# Patient Record
Sex: Male | Born: 1950
Health system: Southern US, Community
[De-identification: ages and names within clinical notes are randomized; demographics above are authoritative.]

## PROBLEM LIST (undated history)

## (undated) DIAGNOSIS — E785 Hyperlipidemia, unspecified: Secondary | ICD-10-CM

## (undated) DIAGNOSIS — I10 Essential (primary) hypertension: Secondary | ICD-10-CM

## (undated) HISTORY — PX: MEDIAL COLLATERAL LIGAMENT AND LATERAL COLLATERAL LIGAMENT REPAIR, KNEE: SHX2017

## (undated) HISTORY — PX: ANTERIOR CRUCIATE LIGAMENT REPAIR: SHX115

## (undated) HISTORY — DX: Hyperlipidemia, unspecified: E78.5

## (undated) HISTORY — DX: Essential (primary) hypertension: I10

---

## 2014-10-29 LAB — LIPID PANEL
Cholesterol: 211 mg/dL — AB (ref 0–200)
HDL: 36 mg/dL (ref 35–70)
LDL Cholesterol: 95 mg/dL
Triglycerides: 398 mg/dL — AB (ref 40–160)

## 2014-10-29 LAB — BASIC METABOLIC PANEL: Creatinine: 1.3 mg/dL (ref 0.6–1.3)

## 2014-10-29 LAB — CBC AND DIFFERENTIAL: HEMATOCRIT: 47 % (ref 41–53)

## 2014-10-29 LAB — TSH: TSH: 2.77 u[IU]/mL (ref 0.41–5.90)

## 2014-10-29 LAB — HEMOGLOBIN A1C: Hemoglobin A1C: 6

## 2016-05-19 ENCOUNTER — Ambulatory Visit (INDEPENDENT_AMBULATORY_CARE_PROVIDER_SITE_OTHER): Payer: Medicare Other | Admitting: Osteopathic Medicine

## 2016-05-19 ENCOUNTER — Encounter: Payer: Self-pay | Admitting: Osteopathic Medicine

## 2016-05-19 VITALS — BP 156/87 | HR 67 | Temp 97.7°F | Wt 207.0 lb

## 2016-05-19 DIAGNOSIS — R0602 Shortness of breath: Secondary | ICD-10-CM | POA: Diagnosis not present

## 2016-05-19 DIAGNOSIS — H6121 Impacted cerumen, right ear: Secondary | ICD-10-CM

## 2016-05-19 DIAGNOSIS — Z23 Encounter for immunization: Secondary | ICD-10-CM

## 2016-05-19 DIAGNOSIS — I10 Essential (primary) hypertension: Secondary | ICD-10-CM

## 2016-05-19 MED ORDER — LISINOPRIL 20 MG PO TABS: 20.0000 mg | ORAL_TABLET | Freq: Every day | ORAL | 3 refills | Status: DC

## 2016-05-19 NOTE — Patient Instructions (Signed)
For shortness of breath: Plan to follow-up with pulmonary function test, we will call you about adding this up. We will also call you once I have reviewed your old records, I like to confirm the lab work, chest x-ray, anything else that may be included. Please let me know if you haven't heard back that I have reviewed these results by the end of next week. We will also get a set up for a home sleep study to evaluate for possible sleep apnea. Restart her home blood pressure medications of the lisinopril, plan to follow-up with me in the office in 2-3 weeks for recheck blood pressure, possible repeat x-rays/labs.  Any other questions or concerns, please do not hesitate to give our clinic or call! Take care! -Dr. Sunnie NielsenA.     You should receive an email asking you to complete a brief survey regarding your experience today at [facility]. Please take a moment to respond to this survey. Our goal is to serve you! Constructive criticism helps us to improve, and positive feedback helps our practice to shine, plus it makes us smile! Thanks in advance for your feedback!

## 2016-05-19 NOTE — Progress Notes (Signed)
HPI: Jesse Romero is a 65 y.o. male  who presents to [facility] [place], 05/[place]/16,  for chief complaint of:  Chief Complaint  Patient presents with  . Establish Care    Chief complaint include establish care, ear pain/hearing loss worse on the right, shortness of breath on exertion.  Shortness of breath: Noticing that he is feeling more short-winded, particularly on exertion, this has been going on for about a year and has been relatively stable. Quit 1995 smoking, 2 packs per day at most, smoker for 30 years total. 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 CXR was normal 03/2016. 04/07/16 negative stress echo, Evaluated by cardiologist. Patient doesn't think he ever had a pulmonary function test. Reports sometime waking up at night to catch his breath, feeling choked. Wife notices this as well but does not notice any episodes of apnea.  Ear pain on the right, difficulty hearing, significant wax in the ear was flushed out by the nurse.   Patient is accompanied by wife who assists with history-taking.   Past medical, surgical, social and family history reviewed: Past Medical History:  Diagnosis Date  . Hyperlipidemia   . Hypertension    Past Surgical History:  Procedure Laterality Date  . ANTERIOR CRUCIATE LIGAMENT REPAIR    . MEDIAL COLLATERAL LIGAMENT AND LATERAL COLLATERAL LIGAMENT REPAIR, KNEE     Social History  Substance Use Topics  . Smoking status: Former Engineer, civil (consulting)moker    Start date: 1995  . Smokeless tobacco: Never Used  . Alcohol use No   History reviewed. No pertinent family history.   Current medication list and allergy/intolerance information reviewed:   Current Outpatient Prescriptions  Medication Sig Dispense Refill  . ibuprofen (ADVIL,MOTRIN) 200 MG tablet Take 200 mg by mouth every 6 (six) hours  as needed.    Marland Kitchen. omeprazole (PRILOSEC) 20 MG capsule Take 20 mg by mouth daily.     No current facility-administered medications for this visit.    No Known Allergies    Review of Systems:  Constitutional:  No  fever, no chills, No recent illness, No unintentional weight changes. No significant fatigue.   HEENT: No  headache, no vision change  Cardiac: No  chest pain, No  pressure, No palpitations, No  Orthopnea  Respiratory:  +shortness of breath. No  Cough  Gastrointestinal: No  abdominal pain, No  nausea, No  vomiting,  No  blood in stool, No  diarrhea, No  constipation   Musculoskeletal: No new myalgia/arthralgia  Skin: No  Rash, No other wounds/concerning lesions  Hem/Onc: No  easy bruising/bleeding, No dark/tarry stool  Endocrine: No cold intolerance,  No heat intolerance. No polyuria/polydipsia/polyphagia   Neurologic: No  weakness, No  dizziness  Psychiatric: No  concerns with depression, No  concerns with anxiety, No sleep problems, No mood problems  Exam:  BP (!) 156/87   Pulse 67   Temp 97.7 F (36.5 C) (Oral)   Wt 207 lb (93.9 kg)   SpO2 98%   Constitutional: VS see above. General Appearance: alert, well-developed, well-nourished, NAD  Eyes: Normal lids and conjunctive, non-icteric sclera  Ears, Nose, Mouth, Throat: MMM, Normal external inspection ears/nares/mouth/lips/gums. TM normal bilaterally.  Neck: No masses, trachea midline. No thyroid enlargement. No tenderness/mass appreciated. No lymphadenopathy  Respiratory: Normal respiratory effort. no wheeze, no rhonchi, no rales  Cardiovascular: S1/S2 normal, no murmur, no rub/gallop auscultated. RRR.  No lower extremity edema.   Gastrointestinal: Nontender, no masses. No hepatomegaly, no splenomegaly. No hernia appreciated. Bowel sounds normal. Rectal exam deferred.   Musculoskeletal: Gait normal. No clubbing/cyanosis of digits.   Neurological: No cranial nerve deficit on limited exam. Motor and  sensation intact and symmetric. Cerebellar reflexes intact. Normal balance/coordination. No tremor.   Skin: warm, dry, intact. No rash/ulcer. No concerning nevi or subq nodules on limited exam.    Psychiatric: Normal judgment/insight. Normal mood and affect. Oriented x3.    Records reviewed from cardiology, normal stress echo, see scanned documents.    ASSESSMENT/PLAN: Await records, sounds like this problem has been worked up already bit by bit over the past year or so, at least by cardiology more recently. Consider pulmonary cause. Need to bring patient back for PFT. Remote history of heavy smoking may be significant. Our clinic's spirometry machine has been having some problems, will check with staff about where he can get PFT performed and we'll order this.   Shortness of breath - Plan: Ambulatory referral to Sleep Studies  Waking at night short of breath - Plan: Ambulatory referral to Sleep Studies  Essential hypertension - Plan: lisinopril (PRINIVIL,ZESTRIL) 20 MG tablet  Need for prophylactic vaccination and inoculation against influenza - Plan: Flu Vaccine QUAD 36+ mos IM      Patient Instructions  For shortness of breath: Plan to follow-up with pulmonary function test, we will call you about adding this up. We will also call you once I have reviewed your old records, I like to confirm the lab work, chest x-ray, anything else that may be included. Please let me know if you haven't heard back that I have reviewed these results by the end of next week. We will also get a set up for a home sleep study to evaluate for possible sleep apnea. Restart your home blood pressure medications of the lisinopril, plan to follow-up with me in the office in 2-3 weeks for recheck blood pressure, possible repeat x-rays/labs.  Any other questions or concerns, please do not hesitate to give our clinic or call! Take care! -Dr. Mervyn Skeeters.       Visit summary with medication list and pertinent instructions  was printed for patient to review. All questions at time of visit were answered - patient instructed to contact office with any additional concerns. ER/RTC precautions were reviewed with the patient. Follow-up plan: Return in about 3 weeks (around 06/09/2016) for RECHECK BLOOD PRESSURE, FOLLOW-UP SHORTNESS OF BREATH (OV30).

## 2016-05-20 DIAGNOSIS — R0602 Shortness of breath: Secondary | ICD-10-CM | POA: Insufficient documentation

## 2016-05-20 DIAGNOSIS — I10 Essential (primary) hypertension: Secondary | ICD-10-CM | POA: Insufficient documentation

## 2016-05-25 ENCOUNTER — Encounter (HOSPITAL_BASED_OUTPATIENT_CLINIC_OR_DEPARTMENT_OTHER): Payer: Self-pay

## 2016-05-25 ENCOUNTER — Emergency Department (HOSPITAL_BASED_OUTPATIENT_CLINIC_OR_DEPARTMENT_OTHER)
Admission: EM | Admit: 2016-05-25 | Discharge: 2016-05-25 | Disposition: A | Discharge status: Home / Self Care | Payer: Medicare Other | Attending: Emergency Medicine | Admitting: Emergency Medicine

## 2016-05-25 DIAGNOSIS — Z87891 Personal history of nicotine dependence: Secondary | ICD-10-CM | POA: Insufficient documentation

## 2016-05-25 DIAGNOSIS — L299 Pruritus, unspecified: Secondary | ICD-10-CM | POA: Diagnosis present

## 2016-05-25 DIAGNOSIS — L237 Allergic contact dermatitis due to plants, except food: Secondary | ICD-10-CM | POA: Insufficient documentation

## 2016-05-25 DIAGNOSIS — Z79899 Other long term (current) drug therapy: Secondary | ICD-10-CM | POA: Diagnosis not present

## 2016-05-25 DIAGNOSIS — I10 Essential (primary) hypertension: Secondary | ICD-10-CM | POA: Insufficient documentation

## 2016-05-25 MED ORDER — HYDROCORTISONE 2.5 % EX LOTN: TOPICAL_LOTION | Freq: Two times a day (BID) | CUTANEOUS | 0 refills | Status: DC

## 2016-05-25 MED ORDER — PREDNISONE 20 MG PO TABS: 60.0000 mg | ORAL_TABLET | Freq: Every day | ORAL | 0 refills | Status: DC

## 2016-05-25 MED ORDER — PREDNISONE 50 MG PO TABS
60.0000 mg | ORAL_TABLET | Freq: Once | ORAL | Status: AC
  Administered 2016-05-25: 60 mg via ORAL
  Filled 2016-05-25: qty 1

## 2016-05-25 NOTE — ED Triage Notes (Signed)
C/o "poison oak" to face and arms-started today after lawn care yesterday-NAD steady gait

## 2016-05-25 NOTE — Discharge Instructions (Signed)
Continue taking Benadryl every 4-6 hours as needed for the rash and itching.

## 2016-05-25 NOTE — ED Provider Notes (Signed)
[facility] Provider Note   CSN: [id number] Arrival date & time: 05/25/16  2054  By signing my name below, I, Jesse Romero, attest that this documentation has been prepared under the direction and in the presence of Santiago GladHeather Keirstan Iannello, PA-C Electronically Signed: Soijett Romero, ED Scribe. 05/25/16. 10:23 PM.   History   Chief Complaint Chief Complaint  Patient presents with  . Poison Oak    HPI Algis LimingMichael Romero is a 65 y.o. male with a PMHx of HTN, hyperlipidemia, who presents to the Emergency Department complaining of pruritic rash onset yesterday. Pt notes that he was completing yardwork yesterday in shorts and noticed the rash following. Pt denies new soaps, medications, pets, environment, lotion, or detergent. Pt is having associated symptoms of color change, numbness of upper lip, and bilateral eye swelling. He notes that he has tried benadryl cream and oral benadryl with no relief of his symptoms. He denies wound, SOB, nausea, vomiting, trouble swallowing, oral swelling, and any other symptoms.    The history is provided by the patient. No language interpreter was used.    Past Medical History:  Diagnosis Date  . Hyperlipidemia   . Hypertension     Patient Active Problem List   Diagnosis Date Noted  . Shortness of breath 05/20/2016  . Waking at night short of breath 05/20/2016  . Essential hypertension 05/20/2016    Past Surgical History:  Procedure Laterality Date  . ANTERIOR CRUCIATE LIGAMENT REPAIR    . MEDIAL COLLATERAL LIGAMENT AND LATERAL COLLATERAL LIGAMENT REPAIR, KNEE         Home Medications    Prior to Admission medications   Medication Sig Start Date End Date Taking? Authorizing Provider  ibuprofen (ADVIL,MOTRIN) 200 MG tablet Take 200 mg by mouth every 6 (six) hours as needed.    Historical Provider, MD  lisinopril (PRINIVIL,ZESTRIL) 20 MG tablet Take 1 tablet (20 mg total) by mouth daily. 05/19/16   [name], DO  omeprazole  (PRILOSEC) 20 MG capsule Take 20 mg by mouth daily.    Historical Provider, MD    Family History No family history on file.  Social History Social History  Substance Use Topics  . Smoking status: Former Engineer, civil (consulting)moker    Start date: 1995  . Smokeless tobacco: Never Used  . Alcohol use No     Allergies   Review of patient's allergies indicates no known allergies.   Review of Systems Review of Systems  HENT: Negative for trouble swallowing.        No oral swelling  Eyes:       Bilateral eye swelling  Gastrointestinal: Negative for nausea and vomiting.  Skin: Positive for color change and rash (BUE and BLE). Negative for wound.  Neurological: Positive for numbness (upper lip).     Physical Exam Updated Vital Signs BP 143/95 (BP Location: Left Arm)   Pulse 79   Temp 97.9 F (36.6 C) (Oral)   Resp 18   Ht 5\' 6"  (1.676 m)   Wt 207 lb (93.9 kg)   SpO2 98%   BMI 33.41 kg/m   Physical Exam  Constitutional: He is oriented to person, place, and time. He appears well-developed and well-nourished. No distress.  HENT:  Head: Normocephalic and atraumatic.  Mouth/Throat: Oropharynx is clear and moist.  No swelling of lips, tongue, or throat. Oropharynx is patent  Eyes: EOM are normal.  Bilateral periorbital eye swelling.   Neck: Normal range of motion. Neck supple.  Cardiovascular: Normal rate and regular rhythm.  Pulmonary/Chest: Effort normal and breath sounds normal. No respiratory distress. He has no wheezes.  Patent airway  Musculoskeletal: Normal range of motion.  Neurological: He is alert and oriented to person, place, and time.  Skin: Skin is warm and dry. Rash noted. Rash is papular.  Erythematous papular rash of the arms and legs bilaterally.   Psychiatric: He has a normal mood and affect. His behavior is normal.  Nursing note and vitals reviewed.    ED Treatments / Results  DIAGNOSTIC STUDIES: Oxygen Saturation is 98% on RA, nl by my interpretation.     COORDINATION OF CARE: 10:20 PM Discussed treatment plan with pt at bedside which includes prednisone and pt agreed to plan.  Procedures Procedures (including critical care time)  Medications Ordered in ED Medications - No data to display   Initial Impression / Assessment and Plan / ED Course  I have reviewed the triage vital signs and the nursing notes.   Clinical Course     Final Clinical Impressions(s) / ED Diagnoses   Final diagnoses:  None   Patient presents today with a chief complaint of a rash.  Rash located on the arms and legs bilaterally.  Appearance consistent with Poison Ivy.  Patient given Prednisone, Hydrocortisone cream, and instructed to continue Benadryl.  No signs of anaphylaxis.  Stable for discharge.  Return precautions given. New Prescriptions New Prescriptions   No medications on file   I personally performed the services described in this documentation, which was scribed in my presence. The recorded information has been reviewed and is accurate.     [name], PA-C 05/26/16 0031    Melene Plan, DO 05/26/16 [phone]

## 2016-05-25 NOTE — ED Notes (Signed)
Pt verbalizes understanding of d/c instructions and denies any further needs at this time. 

## 2016-06-01 ENCOUNTER — Ambulatory Visit (INDEPENDENT_AMBULATORY_CARE_PROVIDER_SITE_OTHER): Payer: Medicare Other | Admitting: Osteopathic Medicine

## 2016-06-01 VITALS — BP 130/80 | HR 94 | Ht 66.0 in | Wt 208.0 lb

## 2016-06-01 DIAGNOSIS — R0602 Shortness of breath: Secondary | ICD-10-CM | POA: Diagnosis not present

## 2016-06-01 MED ORDER — ALBUTEROL SULFATE (2.5 MG/3ML) 0.083% IN NEBU
2.5000 mg | INHALATION_SOLUTION | Freq: Once | RESPIRATORY_TRACT | Status: AC
  Administered 2016-06-01: 2.5 mg via RESPIRATORY_TRACT

## 2016-06-01 NOTE — Progress Notes (Signed)
Pt came into clinic today for spirometry due to increased shortness of breath. Pt was able to preform spirometry successfully, no immediate complications. PCP will review results with Pt.

## 2016-06-01 NOTE — Patient Instructions (Signed)
Referral to pulmonology - let us know if you don't hear about scheduling an appointment If symptoms worsen or change, please seek care ASAP!  Take care! Dr. Mervyn SkeetersA.

## 2016-06-01 NOTE — Progress Notes (Signed)
HPI: Jesse Romero is a 65 y.o. male  who presents to [facility] today, 06/01/16,  for chief complaint of:  Chief Complaint  Patient presents with  . Shortness of Breath    Shortness of breath: Noticing that he is feeling more short-winded, particularly on exertion, this has been going on for about a year and has been relatively stable. Quit 1995 smoking, 2 packs per day at most, smoker for 30 years total. 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 CXR was normal 03/2016. 04/07/16 negative stress echo, Evaluated by cardiologist. Patient doesn't think he ever had a pulmonary function test prior to today. Reports sometime waking up at night to catch his breath, feeling choked. Wife notices this as well but does not notice any episodes of apnea. Scheduled for sleep study later this month. Labs reviewed from visit with previous PCP in August of this year, normal CBC and other labs.   Past medical, surgical, social and family history reviewed: Past Medical History:  Diagnosis Date  . Hyperlipidemia   . Hypertension    Past Surgical History:  Procedure Laterality Date  . ANTERIOR CRUCIATE LIGAMENT REPAIR    . MEDIAL COLLATERAL LIGAMENT AND LATERAL COLLATERAL LIGAMENT REPAIR, KNEE     Social History  Substance Use Topics  . Smoking status: Former Engineer, civil (consulting) date: 1995  . Smokeless tobacco: Never Used  . Alcohol use No   No family history on file.   Current medication list and allergy/intolerance information reviewed:   Current Outpatient Prescriptions  Medication Sig Dispense Refill  . ibuprofen (ADVIL,MOTRIN) 200 MG tablet Take 200 mg by mouth every 6 (six) hours as needed.    Marland Kitchen lisinopril (PRINIVIL,ZESTRIL) 20 MG tablet Take 1 tablet (20 mg total) by mouth daily. 90 tablet 3  . omeprazole (PRILOSEC) 20 MG  capsule Take 20 mg by mouth daily.     No current facility-administered medications for this visit.    No Known Allergies    Review of Systems:  Constitutional:  No  fever, no chills, No recent illness, No unintentional weight changes. No significant fatigue.   Cardiac: No  chest pain, No  pressure, No palpitations, No  Orthopnea  Respiratory:  +shortness of breath. No  Cough  Neurologic: No  weakness, No  dizziness   Exam:  BP 130/80   Pulse 94   Ht 5\' 6"  (1.676 m)   Wt 208 lb (94.3 kg)   SpO2 99%   BMI 33.57 kg/m   Constitutional: VS see above. General Appearance: alert, well-developed, well-nourished, NAD  Ears, Nose, Mouth, Throat: MMM,   Neck: No masses, trachea midline.  Respiratory: Normal respiratory effort. no wheeze, no rhonchi, no rales  Cardiovascular: S1/S2 normal, no murmur, no rub/gallop auscultated. RRR.    PFT INTERPRETATION  FEV1/FVC >70 *AND*  FEV1 >80% predicted  = NORMAL SPIROMETRY NORMAL? yes  1. Valid study? yes 2. Flow-Volume Loop: normal 3. FEV1/FVC: 101% pred, 74.8 at best  >70 = Normal  <70 = Obstructive    = COPD no 3. Severity of Obstruction ~ Post-Bronchodilator FEV1: n/a 5. Bronchodilator Challenge  Increase FEV1 or FVC by 200+mL? no *AND*  Increased same FEV1 or FVC by 12+%? no  Reversible? n/a 6. FVC <80% = Restrictive no  Records reviewed from cardiology, normal stress echo, see scanned documents.    ASSESSMENT/PLAN:    SOB (  shortness of breath) - Plan: Spirometry: Pre & Post Eval, albuterol (PROVENTIL) (2.5 MG/3ML) 0.083% nebulizer solution 2.5 mg, Ambulatory referral to Pulmonology   Cardiac stress echo, labs, PFTs are negative. Refer to pulmonology for further workup of stable dyspnea on exertion.  Patient Instructions  Referral to pulmonology - let us know if you don't hear about scheduling an appointment If symptoms worsen or change, please seek care ASAP!  Take care! Dr. Mervyn SkeetersA.     Visit summary with  medication list and pertinent instructions was printed for patient to review. All questions at time of visit were answered - patient instructed to contact office with any additional concerns. ER/RTC precautions were reviewed with the patient. Follow-up plan: Return for annual physical when due, or sooner if needed.

## 2016-06-08 DIAGNOSIS — I10 Essential (primary) hypertension: Secondary | ICD-10-CM | POA: Diagnosis not present

## 2016-06-08 DIAGNOSIS — R0683 Snoring: Secondary | ICD-10-CM | POA: Diagnosis not present

## 2016-06-08 DIAGNOSIS — R0602 Shortness of breath: Secondary | ICD-10-CM | POA: Diagnosis not present

## 2016-06-08 DIAGNOSIS — G478 Other sleep disorders: Secondary | ICD-10-CM | POA: Diagnosis not present

## 2016-06-09 ENCOUNTER — Ambulatory Visit: Payer: BLUE CROSS/BLUE SHIELD | Admitting: Osteopathic Medicine

## 2016-06-10 ENCOUNTER — Telehealth: Payer: Self-pay | Admitting: Osteopathic Medicine

## 2016-06-10 NOTE — Telephone Encounter (Signed)
Please call patient: I received the results of his sleep study. There is some very minimal apnea which is not problematic and doesn't require treatment since his oxygen levels stayed good.. They also noted possible restless leg. We can talk about this more at his next office visit, let me know if he has any questions.

## 2016-06-10 NOTE — Telephone Encounter (Signed)
SPOKE TO PATIENT GAVE HIM RESULTS AS NOTED BELOW. Rhonda Cunningham,CMA  

## 2016-06-23 ENCOUNTER — Encounter: Payer: Self-pay | Admitting: Osteopathic Medicine

## 2016-09-08 ENCOUNTER — Institutional Professional Consult (permissible substitution): Payer: Medicare Other | Admitting: Pulmonary Disease

## 2016-09-20 ENCOUNTER — Telehealth: Payer: Self-pay | Admitting: Pulmonary Disease

## 2016-09-20 NOTE — Telephone Encounter (Signed)
OK to double book at 4 PM Please let him know that he may have to wait a little bit

## 2016-09-20 NOTE — Telephone Encounter (Signed)
RA  Please Advise-  Jesse BraunKaren form the [place] office called in and wanted to know if we double book you on Feb 8. This pt. Was suppose to see you on 1/18 as a new consult but dur to the office being closed he now needs to be rescheduled.  Pt. Is requesting to be seen in the [place] office

## 2016-09-20 NOTE — Telephone Encounter (Signed)
Pt scheduled for 09/29/16 at 4pm with Dr Vassie LollAlva.  Double book at 4pm per RA Pt aware that this appt has been scheduled.  Aware to arrive by 345p to fill out paperwork.  Nothing further needed.

## 2016-09-29 ENCOUNTER — Ambulatory Visit (INDEPENDENT_AMBULATORY_CARE_PROVIDER_SITE_OTHER): Payer: BLUE CROSS/BLUE SHIELD | Admitting: Pulmonary Disease

## 2016-09-29 ENCOUNTER — Institutional Professional Consult (permissible substitution): Payer: Medicare Other | Admitting: Pulmonary Disease

## 2016-09-29 ENCOUNTER — Encounter: Payer: Self-pay | Admitting: Pulmonary Disease

## 2016-09-29 DIAGNOSIS — R0602 Shortness of breath: Secondary | ICD-10-CM

## 2016-09-29 MED ORDER — LOSARTAN POTASSIUM 50 MG PO TABS: 50.0000 mg | ORAL_TABLET | Freq: Every day | ORAL | 2 refills | Status: DC

## 2016-09-29 NOTE — Patient Instructions (Signed)
Lung function is normal STOP taking lisinopril Take Losartan 50 mg daily instead Check your BP over next week & increase to 100 mg daily if high  Start on an exercise program

## 2016-09-29 NOTE — Assessment & Plan Note (Signed)
There does not seem to be an obvious cause for his dyspnea. Lung function is normal and cardiac stress testing has been okay. He does not have any risk factor for thromboembolism and his symptoms have been stable for 2 years. He roughly correlates this with taking his lisinopril and he does have the upper airway cough related with lisinopril. I have suggested him a trial of stopping the lisinopril. He will take losartan 50 mg instead and titrate to a dose of 100 mg if required for high blood pressure. I would give this at least 8 weeks to see if his dyspnea improves  Otherwise I have also asked him to start a cardiac fitness program since he does appear to be overweight and deconditioned. If his dyspnea gets worse, he will contact us for further workup

## 2016-09-29 NOTE — Progress Notes (Signed)
Subjective:    Patient ID: Jesse Romero, male    DOB: 09-13-1950, 66 y.o.   MRN: [id number]  HPI  Chief Complaint  Patient presents with  . Advice Only    Referred for shortness of breath by Dr. Lyn HollingsheadAlexander. worsening SOB Xapprox 2 years.    66 year old ex-smoker presents for evaluation of dyspnea on exertion. He reports ongoing symptoms for 2 years. Activities such as taking out the trash can hold a long driveway can put him out of breath. He does stay active and is remodeling his home. 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 He smoked 2 packs per day for 25 years before he quit almost 20 years ago.  Spirometry 05/2016 surprisingly showed normal lung function with a ratio of 75, FEV1 of 94% and FVC of 93% and no response to bronchodilator. Echo stress test was negative 05/2016 PSG showed no evidence of obstructive sleep apnea with AHI of 1.4/hour with few PLM's Chest x-ray was reportedly negative per patient although I do not have this report or film to review  He denies seasonal variation of his symptoms. He does report heartburn which is relieved by Prilosec. He has been on lisinopril for hypertension for 2 years and around this time he notices a dry throat clearing kind of cough    Past Medical History:  Diagnosis Date  . Hyperlipidemia   . Hypertension     Past Surgical History:  Procedure Laterality Date  . ANTERIOR CRUCIATE LIGAMENT REPAIR    . MEDIAL COLLATERAL LIGAMENT AND LATERAL COLLATERAL LIGAMENT REPAIR, KNEE       No Known Allergies   Social History   Social History  . Marital status: Single    Spouse name: N/A  . Number of children: N/A  . Years of education: N/A   Occupational History  . Not on file.   Social History Main Topics  . Smoking status: Former Smoker    Packs/day: 2.00    Years: 18.00   Types: Cigarettes    Quit date: 09/29/1996  . Smokeless tobacco: Never Used  . Alcohol use No  . Drug use: Unknown  . Sexual activity: Not Currently    Partners: Female   Other Topics Concern  . Not on file   Social History Narrative  . No narrative on file     No family history on file.   Review of Systems  Constitutional: Negative for fever and unexpected weight change.  HENT: Negative for congestion, dental problem, ear pain, nosebleeds, postnasal drip, rhinorrhea, sinus pressure, sneezing, sore throat and trouble swallowing.   Eyes: Negative for redness and itching.  Respiratory: Positive for chest tightness, shortness of breath and wheezing. Negative for cough.   Cardiovascular: Negative for palpitations and leg swelling.  Gastrointestinal: Negative for nausea and vomiting.  Genitourinary: Negative for dysuria.  Musculoskeletal: Negative for joint swelling.  Skin: Negative for rash.  Neurological: Negative for headaches.  Hematological: Does not bruise/bleed easily.  Psychiatric/Behavioral: Negative for dysphoric mood. The patient is not nervous/anxious.        Objective:   Physical Exam  Gen. Pleasant, obese, in no distress, normal affect ENT - no lesions, no post nasal drip, class 2-3 airway Neck: No JVD, no thyromegaly, no carotid bruits Lungs: no use of accessory muscles, no dullness to percussion, decreased without rales or rhonchi  Cardiovascular: Rhythm regular, heart  sounds  normal, no murmurs or gallops, no peripheral edema Abdomen: soft and non-tender, no hepatosplenomegaly, BS normal. Musculoskeletal: No deformities, no cyanosis or clubbing Neuro:  alert, non focal, no tremors       Assessment & Plan:

## 2016-10-11 ENCOUNTER — Institutional Professional Consult (permissible substitution): Payer: Medicare Other | Admitting: Emergency Medicine

## 2016-11-07 ENCOUNTER — Other Ambulatory Visit: Payer: Self-pay

## 2016-11-07 MED ORDER — LOSARTAN POTASSIUM 50 MG PO TABS: 50.0000 mg | ORAL_TABLET | Freq: Every day | ORAL | 1 refills | Status: AC

## 2016-12-06 ENCOUNTER — Encounter: Payer: Self-pay | Admitting: Sports Medicine

## 2016-12-06 ENCOUNTER — Ambulatory Visit (INDEPENDENT_AMBULATORY_CARE_PROVIDER_SITE_OTHER): Payer: Medicare Other | Admitting: Sports Medicine

## 2016-12-06 DIAGNOSIS — M722 Plantar fascial fibromatosis: Secondary | ICD-10-CM

## 2016-12-06 MED ORDER — MELOXICAM 15 MG PO TABS: ORAL_TABLET | ORAL | 3 refills | Status: DC

## 2016-12-06 NOTE — Assessment & Plan Note (Signed)
Custom orthotics, meloxicam at bedtime, rehabilitation exercises. Return in one month, injection if no better.

## 2016-12-06 NOTE — Progress Notes (Signed)
   Subjective:    I'm seeing this patient as a consultation for:  Dr. Sunnie Nielsen  CC: Bilateral foot pain  HPI: This is a pleasant 66 year old male, for years now he's had bilateral foot pain. Improved slightly with some over-the-counter inserts and a few stretches but still has severe pain in the mornings, he does take a bit of ibuprofen at night. Pain is moderate, persistent without radiation. Tries his best to avoid barefoot walking.  Past medical history:  Negative.  See flowsheet/record as well for more information.  Surgical history: Negative.  See flowsheet/record as well for more information.  Family history: Negative.  See flowsheet/record as well for more information.  Social history: Negative.  See flowsheet/record as well for more information.  Allergies, and medications have been entered into the medical record, reviewed, and no changes needed.   Review of Systems: No headache, visual changes, nausea, vomiting, diarrhea, constipation, dizziness, abdominal pain, skin rash, fevers, chills, night sweats, weight loss, swollen lymph nodes, body aches, joint swelling, muscle aches, chest pain, shortness of breath, mood changes, visual or auditory hallucinations.   Objective:   General: Well Developed, well nourished, and in no acute distress.  Neuro/Psych: Alert and oriented x3, extra-ocular muscles intact, able to move all 4 extremities, sensation grossly intact. Skin: Warm and dry, no rashes noted.  Respiratory: Not using accessory muscles, speaking in full sentences, trachea midline.  Cardiovascular: Pulses palpable, no extremity edema. Abdomen: Does not appear distended. Bilateral feet: No visible erythema or swelling. Range of motion is full in all directions. Strength is 5/5 in all directions. No hallux valgus. No pes cavus or pes planus. No abnormal callus noted. No pain over the navicular prominence, or base of fifth metatarsal. Tender to palpation of the  calcaneal insertion of plantar fascia. No pain at the Achilles insertion. No pain over the calcaneal bursa. No pain of the retrocalcaneal bursa. No tenderness to palpation over the tarsals, metatarsals, or phalanges. No hallux rigidus or limitus. No tenderness palpation over interphalangeal joints. No pain with compression of the metatarsal heads. Neurovascularly intact distally.  Patient was fitted for a : standard, cushioned, semi-rigid orthotic. The orthotic was heated and afterward the patient stood on the orthotic blank positioned on the orthotic stand. The patient was positioned in subtalar neutral position and 10 degrees of ankle dorsiflexion in a weight bearing stance. After completion of molding, a stable base was applied to the orthotic blank. The blank was ground to a stable position for weight bearing. Size: 11 Base: [facility], hospital and Padding: None The patient ambulated these, and they were very comfortable.  Impression and Recommendations:   This case required medical decision making of moderate complexity.  Plantar fasciitis, bilateral Custom orthotics, meloxicam at bedtime, rehabilitation exercises. Return in one month, injection if no better.

## 2016-12-08 ENCOUNTER — Encounter: Payer: Self-pay | Admitting: *Deleted

## 2016-12-08 ENCOUNTER — Emergency Department (INDEPENDENT_AMBULATORY_CARE_PROVIDER_SITE_OTHER)
Admission: EM | Admit: 2016-12-08 | Discharge: 2016-12-08 | Disposition: A | Discharge status: Home / Self Care | Payer: Medicare Other | Source: Home / Self Care | Attending: Family Medicine | Admitting: Family Medicine

## 2016-12-08 DIAGNOSIS — S51812A Laceration without foreign body of left forearm, initial encounter: Secondary | ICD-10-CM

## 2016-12-08 DIAGNOSIS — Z23 Encounter for immunization: Secondary | ICD-10-CM | POA: Diagnosis not present

## 2016-12-08 MED ORDER — TETANUS-DIPHTH-ACELL PERTUSSIS 5-2.5-18.5 LF-MCG/0.5 IM SUSP
0.5000 mL | Freq: Once | INTRAMUSCULAR | Status: AC
  Administered 2016-12-08: 0.5 mL via INTRAMUSCULAR

## 2016-12-08 NOTE — ED Provider Notes (Signed)
[facility]    CSN: [id number] Arrival date & time: 12/08/16  1106     History   Chief Complaint Chief Complaint  Patient presents with  . Laceration    HPI Nathaniel Wakeley is a 66 y.o. male.   Patient was sawing plywood on his porch with a circular saw when wind lifted the board, resulting in laceration to his left forearm.  His last Tdap was about 5 to 6 years ago.   The history is provided by the patient.  Laceration  Location:  Shoulder/arm Shoulder/arm laceration location:  L forearm Length:  2.5cm Depth:  Through dermis Quality: straight   Bleeding: controlled   Time since incident:  1 hour Injury mechanism: circular saw blade. Pain details:    Quality:  Aching   Severity:  Mild   Timing:  Constant   Progression:  Partially resolved Foreign body present:  No foreign bodies Ineffective treatments:  None tried Tetanus status:  Out of date Associated symptoms: no numbness and no swelling     Past Medical History:  Diagnosis Date  . Hyperlipidemia   . Hypertension     Patient Active Problem List   Diagnosis Date Noted  . Plantar fasciitis, bilateral 12/06/2016  . Shortness of breath 05/20/2016  . Waking at night short of breath 05/20/2016  . Essential hypertension 05/20/2016    Past Surgical History:  Procedure Laterality Date  . ANTERIOR CRUCIATE LIGAMENT REPAIR    . MEDIAL COLLATERAL LIGAMENT AND LATERAL COLLATERAL LIGAMENT REPAIR, KNEE         Home Medications    Prior to Admission medications   Medication Sig Start Date End Date Taking? Authorizing Provider  losartan (COZAAR) 50 MG tablet Take 1 tablet (50 mg total) by mouth daily. 11/07/16 11/07/17 Yes Oretha Milch, MD  omeprazole (PRILOSEC) 20 MG capsule Take 20 mg by mouth daily.   Yes Historical Provider, MD  ibuprofen (ADVIL,MOTRIN) 200 MG tablet Take 200 mg by mouth every 6 (six) hours as needed.    Historical Provider, MD  meloxicam (MOBIC) 15 MG tablet One tab PO qHS for  2 weeks, then daily prn pain. 12/06/16   Monica Becton, MD    Family History History reviewed. No pertinent family history.  Social History Social History  Substance Use Topics  . Smoking status: Former Smoker    Packs/day: 2.00    Years: 18.00    Types: Cigarettes    Quit date: 09/29/1996  . Smokeless tobacco: Never Used  . Alcohol use No     Allergies   Patient has no known allergies.   Review of Systems Review of Systems  All other systems reviewed and are negative.    Physical Exam Triage Vital Signs ED Triage Vitals [12/08/16 1133]  Enc Vitals Group     BP (!) 141/96     Pulse Rate 79     Resp      Temp 97.9 F (36.6 C)     Temp Source Oral     SpO2 98 %     Weight 222 lb (100.7 kg)     Height      Head Circumference      Peak Flow      Pain Score 0     Pain Loc      Pain Edu?      Excl. in GC?    No data found.   Updated Vital Signs BP (!) 141/96 (BP Location: Right Arm)  Pulse 79   Temp 97.9 F (36.6 C) (Oral)   Wt 222 lb (100.7 kg)   SpO2 98%   BMI 35.83 kg/m   Visual Acuity Right Eye Distance:   Left Eye Distance:   Bilateral Distance:    Right Eye Near:   Left Eye Near:    Bilateral Near:     Physical Exam  Constitutional: He appears well-developed and well-nourished. No distress.  HENT:  Head: Atraumatic.  Eyes: Pupils are equal, round, and reactive to light.  Cardiovascular: Normal rate.   Pulmonary/Chest: Effort normal.  Musculoskeletal:       Left forearm: He exhibits laceration.       Arms: Left forearm has a superficial linear laceration 2.5cm long by 5mm wide as noted on diagram.  Distal neurovascular function is intact.   Neurological: He is alert.  Skin: Skin is warm and dry.  Nursing note and vitals reviewed.    UC Treatments / Results  Labs (all labs ordered are listed, but only abnormal results are displayed) Labs Reviewed - No data to display  EKG  EKG Interpretation None        Radiology No results found.  Procedures Procedures  Laceration Repair Discussed benefits and risks of procedure and verbal consent obtained. Using sterile technique and local anesthesia with 1% lidocaine with epinephrine, cleansed wound with Betadine followed by copious lavage with normal saline.  Wound carefully inspected for debris and foreign bodies; none found.  Undermined dermis at medial end of wound with #11 blade.  Wound closed with #5, 4-0 interrupted Prolene sutures.  Bacitracin and non-stick sterile dressing applied.  Wound precautions explained to patient.  Return for suture removal in 10 days.   Medications Ordered in UC Medications  Tdap (BOOSTRIX) injection 0.5 mL (not administered)     Initial Impression / Assessment and Plan / UC Course  I have reviewed the triage vital signs and the nursing notes.  Pertinent labs & imaging results that were available during my care of the patient were reviewed by me and considered in my medical decision making (see chart for details).    Administered Tdap  Change dressing daily and apply Bacitracin ointment to wound.  Keep wound clean and dry.  Return for any signs of infection (or follow-up with family doctor):  Increasing redness, swelling, pain, heat, drainage, etc. Return in 10 days for suture removal.      Final Clinical Impressions(s) / UC Diagnoses   Final diagnoses:  Laceration of left forearm, initial encounter    New Prescriptions New Prescriptions   No medications on file     Lattie Haw, MD 12/08/16 1208

## 2016-12-08 NOTE — Discharge Instructions (Signed)
Change dressing daily and apply Bacitracin ointment to wound.  Keep wound clean and dry.  Return for any signs of infection (or follow-up with family doctor):  Increasing redness, swelling, pain, heat, drainage, etc. °Return in 10 days for suture removal.   °

## 2016-12-08 NOTE — ED Triage Notes (Addendum)
Patient c/o cutting wood with his saw today and caused laceration to LFA. Patient was doing work for himself. Bleeding has stopped. Cleaned site with Hibiclens. Reports last TDaP was 5-6 years ago.

## 2017-01-03 ENCOUNTER — Ambulatory Visit: Payer: Medicare Other | Admitting: Sports Medicine

## 2017-03-30 ENCOUNTER — Ambulatory Visit (INDEPENDENT_AMBULATORY_CARE_PROVIDER_SITE_OTHER): Payer: Medicare Other | Admitting: Sports Medicine

## 2017-03-30 ENCOUNTER — Encounter: Payer: Self-pay | Admitting: Sports Medicine

## 2017-03-30 DIAGNOSIS — M722 Plantar fascial fibromatosis: Secondary | ICD-10-CM

## 2017-03-30 DIAGNOSIS — L237 Allergic contact dermatitis due to plants, except food: Secondary | ICD-10-CM | POA: Insufficient documentation

## 2017-03-30 MED ORDER — METHYLPREDNISOLONE ACETATE 40 MG/ML IJ SUSP
40.0000 mg | Freq: Once | INTRAMUSCULAR | Status: AC
  Administered 2017-03-30: 40 mg via INTRAMUSCULAR

## 2017-03-30 MED ORDER — METHYLPREDNISOLONE SODIUM SUCC 125 MG IJ SOLR
125.0000 mg | Freq: Once | INTRAMUSCULAR | Status: AC
  Administered 2017-03-30: 125 mg via INTRAMUSCULAR

## 2017-03-30 MED ORDER — TRIAMCINOLONE ACETONIDE 0.1 % EX OINT: 1.0000 "application " | TOPICAL_OINTMENT | Freq: Two times a day (BID) | CUTANEOUS | 6 refills | Status: DC

## 2017-03-30 NOTE — Addendum Note (Signed)
Addended by: Avon GullyMCCRIMMON, Kendell Sagraves C on: 03/30/2017 12:30 PM   Modules accepted: Orders

## 2017-03-30 NOTE — Progress Notes (Signed)
  Subjective:    CC: Skin rash  HPI: This is a pleasant 66 year old male, for several days now after working outside he's developed poison oak rash over his legs and feet. Highly pruritic. Desires interventional treatment for this. Symptoms are moderate, persistent.  Plantar fasciitis: Resolved with custom orthotics and rehabilitation exercises.  Past medical history:  Negative.  See flowsheet/record as well for more information.  Surgical history: Negative.  See flowsheet/record as well for more information.  Family history: Negative.  See flowsheet/record as well for more information.  Social history: Negative.  See flowsheet/record as well for more information.  Allergies, and medications have been entered into the medical record, reviewed, and no changes needed.   Review of Systems: No fevers, chills, night sweats, weight loss, chest pain, or shortness of breath.   Objective:    General: Well Developed, well nourished, and in no acute distress.  Neuro: Alert and oriented x3, extra-ocular muscles intact, sensation grossly intact.  HEENT: Normocephalic, atraumatic, pupils equal round reactive to light, neck supple, no masses, no lymphadenopathy, thyroid nonpalpable.  Skin: Warm and dry, There is a papular rash with minimal erythema, vesicles over the feet and legs consistent with poison oak versus poison ivy dermatitis Cardiac: Regular rate and rhythm, no murmurs rubs or gallops, no lower extremity edema.  Respiratory: Clear to auscultation bilaterally. Not using accessory muscles, speaking in full sentences.  Impression and Recommendations:    Poison oak dermatitis Solu-Medrol 125, Depo-Medrol 40 intramuscular. Topical triamcinolone ointment. Return as needed for this.  Plantar fasciitis, bilateral Improved considerably with custom orthotics, meloxicam, rehabilitation exercises. He is doing some shots of steroid today for poison oak dermatitis, if his pain recurs in a month I am  happy to do an actual plantar fascia injection.  I spent 25 minutes with this patient, greater than 50% was face-to-face time counseling regarding the above diagnoses

## 2017-03-30 NOTE — Assessment & Plan Note (Signed)
Solu-Medrol 125, Depo-Medrol 40 intramuscular. Topical triamcinolone ointment. Return as needed for this.

## 2017-03-30 NOTE — Assessment & Plan Note (Signed)
Improved considerably with custom orthotics, meloxicam, rehabilitation exercises. He is doing some shots of steroid today for poison oak dermatitis, if his pain recurs in a month I am happy to do an actual plantar fascia injection.

## 2017-10-23 LAB — HM COLONOSCOPY

## 2018-07-03 ENCOUNTER — Ambulatory Visit (INDEPENDENT_AMBULATORY_CARE_PROVIDER_SITE_OTHER): Payer: Medicare PPO | Admitting: Osteopathic Medicine

## 2018-07-03 ENCOUNTER — Encounter: Payer: Self-pay | Admitting: Osteopathic Medicine

## 2018-07-03 DIAGNOSIS — R140 Abdominal distension (gaseous): Secondary | ICD-10-CM

## 2018-07-03 DIAGNOSIS — R109 Unspecified abdominal pain: Secondary | ICD-10-CM | POA: Diagnosis not present

## 2018-07-03 DIAGNOSIS — Z23 Encounter for immunization: Secondary | ICD-10-CM | POA: Diagnosis not present

## 2018-07-03 LAB — CBC WITH DIFFERENTIAL/PLATELET
BASOS PCT: 0.8 %
Basophils Absolute: 60 cells/uL (ref 0–200)
Eosinophils Absolute: 518 cells/uL — ABNORMAL HIGH (ref 15–500)
Eosinophils Relative: 6.9 %
HEMATOCRIT: 46.1 % (ref 38.5–50.0)
Hemoglobin: 16 g/dL (ref 13.2–17.1)
LYMPHS ABS: 2438 {cells}/uL (ref 850–3900)
MCH: 31.3 pg (ref 27.0–33.0)
MCHC: 34.7 g/dL (ref 32.0–36.0)
MCV: 90.2 fL (ref 80.0–100.0)
MPV: 10.7 fL (ref 7.5–12.5)
Monocytes Relative: 8.5 %
NEUTROS PCT: 51.3 %
Neutro Abs: 3848 cells/uL (ref 1500–7800)
Platelets: 205 10*3/uL (ref 140–400)
RBC: 5.11 10*6/uL (ref 4.20–5.80)
RDW: 12.5 % (ref 11.0–15.0)
Total Lymphocyte: 32.5 %
WBC: 7.5 10*3/uL (ref 3.8–10.8)
WBCMIX: 638 {cells}/uL (ref 200–950)

## 2018-07-03 LAB — COMPLETE METABOLIC PANEL WITH GFR
AG RATIO: 1.8 (calc) (ref 1.0–2.5)
ALT: 19 U/L (ref 9–46)
AST: 17 U/L (ref 10–35)
Albumin: 4.4 g/dL (ref 3.6–5.1)
Alkaline phosphatase (APISO): 56 U/L (ref 40–115)
BUN/Creatinine Ratio: 15 (calc) (ref 6–22)
BUN: 20 mg/dL (ref 7–25)
CALCIUM: 10.4 mg/dL — AB (ref 8.6–10.3)
CO2: 28 mmol/L (ref 20–32)
Chloride: 103 mmol/L (ref 98–110)
Creat: 1.35 mg/dL — ABNORMAL HIGH (ref 0.70–1.25)
GFR, EST AFRICAN AMERICAN: 62 mL/min/{1.73_m2} (ref >=60)
GFR, EST NON AFRICAN AMERICAN: 54 mL/min/{1.73_m2} — AB (ref >=60)
GLOBULIN: 2.5 g/dL (ref 1.9–3.7)
Glucose, Bld: 118 mg/dL — ABNORMAL HIGH (ref 65–99)
POTASSIUM: 5 mmol/L (ref 3.5–5.3)
Sodium: 139 mmol/L (ref 135–146)
Total Bilirubin: 0.6 mg/dL (ref 0.2–1.2)
Total Protein: 6.9 g/dL (ref 6.1–8.1)

## 2018-07-03 LAB — LIPASE: Lipase: 33 U/L (ref 7–60)

## 2018-07-03 MED ORDER — DICYCLOMINE HCL 20 MG PO TABS: 20.0000 mg | ORAL_TABLET | Freq: Three times a day (TID) | ORAL | 1 refills | Status: AC | PRN

## 2018-07-03 NOTE — Progress Notes (Signed)
HPI: Jesse Romero is a 67 y.o. male who  has a past medical history of Hyperlipidemia and Hypertension.  [facility], 07/03/18,  for chief complaint of:  Abd pain / GI concern   Abd pain and bloating on and off about a year.   Pain/cramping around side/obliques about a week ago, for a few days, and has since resolved.   Bloating reports gets this occasionally, feels like he overate, even if he didn't. Happens pretty much every meal. No painful swallowing or early satiety.   Normal BM in frequency and character, normal urination. No unintentional weight loss.   Colonoscopy reviewed: 10/2017 (+)2 mm polyp ascending colon, 3 mm polyp descending colon, 2 mm polyp in rectum, no other abn.      Past medical history, surgical history, and family history reviewed.  Current medication list and allergy/intolerance information reviewed.   (See remainder of HPI, ROS, Phys Exam below)  Wt Readings from Last 3 Encounters:  07/03/18 208 lb 3.2 oz (94.4 kg)  03/30/17 212 lb (96.2 kg)  12/08/16 222 lb (100.7 kg)   Past Surgical History:  Procedure Laterality Date  . ANTERIOR CRUCIATE LIGAMENT REPAIR    . MEDIAL COLLATERAL LIGAMENT AND LATERAL COLLATERAL LIGAMENT REPAIR, KNEE           ASSESSMENT/PLAN: Diagnoses of Abdominal bloating, Abdominal cramping, and Need for influenza vaccination were pertinent to this visit.  Benign exam and no red flag symptoms, symptoms a bit more consistent with irritable bowel type picture.  May consider further work-up depending on how he responds to dietary modifications, Bentyl trial, see pt instructions     Meds ordered this encounter  Medications  . dicyclomine (BENTYL) 20 MG tablet    Sig: Take 1 tablet (20 mg total) by mouth 3 (three) times daily as needed for spasms (abdominal).    Dispense:  90 tablet    Refill:  1   Orders Placed This Encounter  Procedures  . Flu vaccine HIGH DOSE  PF (Fluzone High dose)  . CBC with Differential/Platelet  . COMPLETE METABOLIC PANEL WITH GFR  . Lipase     Patient Instructions  Plan:  Okay to continue omeprazole  I am adding a medicine called dicyclomine/Bentyl to take as needed for abdominal pain/cramping.  Try taking this 20 to 30 minutes before meals.  Can also add over-the-counter peppermint oil and probiotic.   See below for low FODMAP diet, if you notice any foods on this list that are particular triggers for you, try eliminating them from your diet.  If symptoms worsen or fail to improve, we may consider GI referral and/or imaging   Follow-up plan: Return in about 4 weeks (around 07/31/2018) for recheck abdominal bloating/pain - see me sooner if needed.                                  ############################################ ############################################ ############################################ ############################################    Outpatient Encounter Medications as of 07/03/2018  Medication Sig  . ibuprofen (ADVIL,MOTRIN) 200 MG tablet Take 200 mg by mouth every 6 (six) hours as needed.  Marland Kitchen. omeprazole (PRILOSEC) 20 MG capsule Take 20 mg by mouth daily.  Marland Kitchen. losartan (COZAAR) 50 MG tablet Take 1 tablet (50 mg total) by mouth daily.  . meloxicam (MOBIC) 15 MG tablet One tab PO qHS for 2 weeks, then daily prn pain. (Patient not taking: Reported on 07/03/2018)  . [  DISCONTINUED] triamcinolone ointment (KENALOG) 0.1 % Apply 1 application topically 2 (two) times daily. To affected areas (Patient not taking: Reported on 07/03/2018)   No facility-administered encounter medications on file as of 07/03/2018.    No Known Allergies    Review of Systems:  Constitutional: No recent illness  HEENT: No  headache, no vision change  Cardiac: No  chest pain, No  pressure, No palpitations  Respiratory:  No  shortness of breath. No  Cough  Gastrointestinal:  +abdominal pain, no change on bowel habits  Musculoskeletal: No new myalgia/arthralgia  Skin: No  Rash  Neurologic: No  weakness, No  Dizziness  Psychiatric: No  concerns with depression, No  concerns with anxiety  Exam:  BP 130/81 (BP Location: Left Arm, Patient Position: Sitting, Cuff Size: Normal)   Pulse 68   Temp 97.9 F (36.6 C) (Oral)   Wt 208 lb 3.2 oz (94.4 kg)   BMI 33.60 kg/m   Constitutional: VS see above. General Appearance: alert, well-developed, well-nourished, NAD  Eyes: Normal lids and conjunctive, non-icteric sclera  Ears, Nose, Mouth, Throat: MMM, Normal external inspection ears/nares/mouth/lips/gums.  Neck: No masses, trachea midline.   Respiratory: Normal respiratory effort. no wheeze, no rhonchi, no rales  Cardiovascular: S1/S2 normal, no murmur, no rub/gallop auscultated. RRR.   Musculoskeletal: Gait normal. Symmetric and independent movement of all extremities  Abd: Soft, nontender, nondistended.  Bowel sounds within normal limits x4 quadrants.  No rebound or guarding.  Normal percussion.  Rectal exam deferred  Neurological: Normal balance/coordination. No tremor.  Skin: warm, dry, intact.   Psychiatric: Normal judgment/insight. Normal mood and affect. Oriented x3.   Visit summary with medication list and pertinent instructions was printed for patient to review, advised to alert [place] if any changes needed. All questions at time of visit were answered - patient instructed to contact office with any additional concerns. ER/RTC precautions were reviewed with the patient and understanding verbalized.   Follow-up plan: Return in about 4 weeks (around 07/31/2018) for recheck abdominal bloating/pain - see me sooner if needed.     Please note: voice recognition software was used to produce this document, and typos may escape review. Please contact Dr. Lyn Hollingshead for any needed clarifications.

## 2018-07-03 NOTE — Patient Instructions (Signed)
Plan:  Okay to continue omeprazole  I am adding a medicine called dicyclomine/Bentyl to take as needed for abdominal pain/cramping.  Try taking this 20 to 30 minutes before meals.  Can also add over-the-counter peppermint oil and probiotic.   See below for low FODMAP diet, if you notice any foods on this list that are particular triggers for you, try eliminating them from your diet.  If symptoms worsen or fail to improve, we may consider GI referral and/or imaging

## 2018-07-31 ENCOUNTER — Ambulatory Visit: Payer: Medicare PPO | Admitting: Osteopathic Medicine

## 2018-08-24 ENCOUNTER — Ambulatory Visit: Payer: Medicare PPO | Admitting: Osteopathic Medicine

## 2018-08-29 ENCOUNTER — Encounter: Payer: Self-pay | Admitting: Osteopathic Medicine

## 2018-08-29 ENCOUNTER — Ambulatory Visit (INDEPENDENT_AMBULATORY_CARE_PROVIDER_SITE_OTHER): Payer: Medicare PPO | Admitting: Osteopathic Medicine

## 2018-08-29 VITALS — BP 125/80 | HR 67 | Temp 97.9°F | Wt 213.7 lb

## 2018-08-29 DIAGNOSIS — R7309 Other abnormal glucose: Secondary | ICD-10-CM

## 2018-08-29 DIAGNOSIS — E291 Testicular hypofunction: Secondary | ICD-10-CM

## 2018-08-29 DIAGNOSIS — Z13220 Encounter for screening for lipoid disorders: Secondary | ICD-10-CM | POA: Diagnosis not present

## 2018-08-29 DIAGNOSIS — Z0000 Encounter for general adult medical examination without abnormal findings: Secondary | ICD-10-CM | POA: Diagnosis not present

## 2018-08-29 DIAGNOSIS — Z125 Encounter for screening for malignant neoplasm of prostate: Secondary | ICD-10-CM | POA: Diagnosis not present

## 2018-08-29 DIAGNOSIS — R5382 Chronic fatigue, unspecified: Secondary | ICD-10-CM

## 2018-08-29 DIAGNOSIS — Z87891 Personal history of nicotine dependence: Secondary | ICD-10-CM

## 2018-08-29 MED ORDER — ZOSTER VAC RECOMB ADJUVANTED 50 MCG/0.5ML IM SUSR: 0.5000 mL | Freq: Once | INTRAMUSCULAR | 1 refills | Status: AC

## 2018-08-29 NOTE — Progress Notes (Signed)
HPI: Jesse Romero is a 68 y.o. male who  has a past medical history of Hyperlipidemia and Hypertension.  he presents to [facility] today, 08/29/18,  for chief complaint of: Annual Physical Follow-up abdominal bloating   Patient here for annual physical / wellness exam.  See preventive care reviewed as below.  Recent labs reviewed in detail with the patient.   Additional concerns today include:  Would like to have testosterone llevels checked, (+)fatigue, feeling a bit down, noting low libido.   Seen about a month and a half ago, 07/03/2017, complaint of abdominal pain and bloating on and off for about a year.  Exam was benign, no red flag symptoms decided to trial Bentyl as needed and treat as irritable bowel type syndrome -okay to continue omeprazole, can add peppermint oil and probiotic over-the-counter, information given for low FODMAP diet. The patient reports that over the past 3 weeks, he has had no issues.      Past medical, surgical, social and family history reviewed:  Patient Active Problem List   Diagnosis Date Noted  . Poison oak dermatitis 03/30/2017  . Plantar fasciitis, bilateral 12/06/2016  . Shortness of breath 05/20/2016  . Waking at night short of breath 05/20/2016  . Essential hypertension 05/20/2016    Past Surgical History:  Procedure Laterality Date  . ANTERIOR CRUCIATE LIGAMENT REPAIR    . MEDIAL COLLATERAL LIGAMENT AND LATERAL COLLATERAL LIGAMENT REPAIR, KNEE      Social History   Tobacco Use  . Smoking status: Former Smoker    Packs/day: 2.00    Years: 18.00    Pack years: 36.00    Types: Cigarettes    Last attempt to quit: 09/29/1996    Years since quitting: 21.9  . Smokeless tobacco: Never Used  Substance Use Topics  . Alcohol use: No    No family history on file.   Current medication list and allergy/intolerance information reviewed:    Current Outpatient Medications  Medication Sig Dispense  Refill  . dicyclomine (BENTYL) 20 MG tablet Take 1 tablet (20 mg total) by mouth 3 (three) times daily as needed for spasms (abdominal). 90 tablet 1  . ibuprofen (ADVIL,MOTRIN) 200 MG tablet Take 200 mg by mouth every 6 (six) hours as needed.    Marland Kitchen omeprazole (PRILOSEC) 20 MG capsule Take 20 mg by mouth daily.    Marland Kitchen losartan (COZAAR) 50 MG tablet Take 1 tablet (50 mg total) by mouth daily. 90 tablet 1  . Zoster Vaccine Adjuvanted [facility]) injection Inject 0.5 mLs into the muscle once for 1 dose. Repeat in 2 to 6 months. Please fax admin records to [phone] 0.5 mL 1   No current facility-administered medications for this visit.     No Known Allergies    Review of Systems:  Constitutional:  No  fever, no chills, No recent illness, No unintentional weight changes. +significant fatigue.   HEENT: No  headache, no vision change, no hearing change, No sore throat, No  sinus pressure  Cardiac: No  chest pain, No  pressure, No palpitations, No  Orthopnea  Respiratory:  No  shortness of breath. No  Cough  Gastrointestinal: No  abdominal pain, No  nausea, No  vomiting,  No  blood in stool, No  diarrhea, No  constipation   Musculoskeletal: No new myalgia/arthralgia  Skin: No  Rash, No other wounds/concerning lesions  Genitourinary: No  incontinence, No  abnormal genital bleeding, No abnormal genital discharge  Hem/Onc: No  easy  bruising/bleeding, No  abnormal lymph node  Endocrine: No cold intolerance,  No heat intolerance. No polyuria/polydipsia/polyphagia   Neurologic: No  weakness, No  dizziness, No  slurred speech/focal weakness/facial droop  Psychiatric: No  concerns with depression but feeling a bit down lately, No  concerns with anxiety, No sleep problems, No mood problems  Exam:  BP 125/80 (BP Location: Left Arm, Patient Position: Sitting, Cuff Size: Normal)   Pulse 67   Temp 97.9 F (36.6 C) (Oral)   Wt 213 lb 11.2 oz (96.9 kg)   BMI 34.49 kg/m   Constitutional: VS  see above. General Appearance: alert, well-developed, well-nourished, NAD  Eyes: Normal lids and conjunctive, non-icteric sclera  Ears, Nose, Mouth, Throat: MMM, Normal external inspection ears/nares/mouth/lips/gums. TM normal bilaterally. Pharynx/tonsils no erythema, no exudate. Nasal mucosa normal.   Neck: No masses, trachea midline. No thyroid enlargement. No tenderness/mass appreciated. No lymphadenopathy  Respiratory: Normal respiratory effort. no wheeze, no rhonchi, no rales  Cardiovascular: S1/S2 normal, no murmur, no rub/gallop auscultated. RRR. No lower extremity edema.   Gastrointestinal: Nontender, no masses. No hepatomegaly, no splenomegaly. No hernia appreciated. Bowel sounds normal. Rectal exam deferred.   Musculoskeletal: Gait normal. No clubbing/cyanosis of digits.   Neurological: Normal balance/coordination. No tremor. No cranial nerve deficit on limited exam. Motor and sensation intact and symmetric. Cerebellar reflexes intact.   Skin: warm, dry, intact. No rash/ulcer. No concerning nevi or subq nodules on limited exam.    Psychiatric: Normal judgment/insight. Normal mood and affect. Oriented x3.     ASSESSMENT/PLAN: The primary encounter diagnosis was Annual physical exam. Diagnoses of Prostate cancer screening, Lipid screening, Elevated hemoglobin A1c, Chronic fatigue, and Former smoker were also pertinent to this visit.   Orders Placed This Encounter  Procedures  . US ABDOMINAL AORTA SCREENING AAA  . CBC with Differential/Platelet  . COMPLETE METABOLIC PANEL WITH GFR  . Lipid panel  . Hemoglobin A1c  . PSA, Total with Reflex to PSA, Free  . Testosterone   Discussed options for testosterone replacement if labs turn out to be in low levels and confirmatory labs show any concerns.  Patient will think about this and we will be in touch about the results when available.  Discussed risks versus benefits of treatment, if you end up on treatment and it is improving  his energy levels/mood and lab parameters are normal, okay to continue but if not, would bring him off of this medicine.  Meds ordered this encounter  Medications  . Zoster Vaccine Adjuvanted [facility](SHINGRIX) injection    Sig: Inject 0.5 mLs into the muscle once for 1 dose. Repeat in 2 to 6 months. Please fax admin records to [phone]    Dispense:  0.5 mL    Refill:  1    Patient Instructions  General Preventive Care  Most recent routine screening lipids/other labs:   Tobacco: don't!   Alcohol: responsible moderation is ok for most adults - if you have concerns about your alcohol intake, please talk to me!   Exercise: as tolerated to reduce risk of cardiovascular disease and diabetes. Strength training will also prevent osteoporosis.   Mental health: if need for mental health care (medicines, counseling, other), or concerns about moods, please let me know!   Sexual health: if need for STD testing, or if concerns with libido/pain problems, please let me know!   Advanced Directive: Living Will and/or Healthcare Power of Attorney recommended for all adults, regardless of age or health.  Vaccines  Flu vaccine: recommended for  almost everyone, every fall.   Shingles vaccine: Shingrix recommended after age 35. Some people have had the old Zostavax vaccine. See printed Rx, Medicare will not pay for vaccine in the office but will pay for it if administered at your pharmacy.   Pneumonia vaccines: you've completed this series  Tetanus booster: done 11/2016. Tdap recommended every 10 years.  Cancer screenings   Colon cancer screening: follow up as directed by GI   Prostate cancer screening: PSA blood test around age 22-71  Lung cancer screening: not needed since you quit smoking >15 years ago.  Infection screenings . HIV, Gonorrhea/Chlamydia: screening as needed. . Hepatitis C: recommended once for anyone born 1945-1965, negative in 03/2016, not needed again.  . TB: certain at-risk  populations, or depending on work requirements and/or travel history Other . Bone Density Test: recommended for men at age 32 . Abdominal Aortic Aneurysm: screening with ultrasound recommended once for men age 78-75 who have ever smoked, I went ahead and ordered this test.             Visit summary with medication list and pertinent instructions was printed for patient to review. All questions at time of visit were answered - patient instructed to contact office with any additional concerns or updates. ER/RTC precautions were reviewed with the patient.     Please note: voice recognition software was used to produce this document, and typos may escape review. Please contact Dr. Lyn Hollingshead for any needed clarifications.     Follow-up plan: Return in about 1 year (around 08/30/2019) for ANNUAL PHYSICAL. - SOONER BASED ON LAB RESULTS .

## 2018-08-29 NOTE — Patient Instructions (Addendum)
General Preventive Care  Most recent routine screening lipids/other labs:   Tobacco: don't!   Alcohol: responsible moderation is ok for most adults - if you have concerns about your alcohol intake, please talk to me!   Exercise: as tolerated to reduce risk of cardiovascular disease and diabetes. Strength training will also prevent osteoporosis.   Mental health: if need for mental health care (medicines, counseling, other), or concerns about moods, please let me know!   Sexual health: if need for STD testing, or if concerns with libido/pain problems, please let me know!   Advanced Directive: Living Will and/or Healthcare Power of Attorney recommended for all adults, regardless of age or health.  Vaccines  Flu vaccine: recommended for almost everyone, every fall.   Shingles vaccine: Shingrix recommended after age 39. Some people have had the old Zostavax vaccine. See printed Rx, Medicare will not pay for vaccine in the office but will pay for it if administered at your pharmacy.   Pneumonia vaccines: you've completed this series  Tetanus booster: done 11/2016. Tdap recommended every 10 years.  Cancer screenings   Colon cancer screening: follow up as directed by GI   Prostate cancer screening: PSA blood test around age 64-71  Lung cancer screening: not needed since you quit smoking >15 years ago.  Infection screenings . HIV, Gonorrhea/Chlamydia: screening as needed. . Hepatitis C: recommended once for anyone born 1946-1965, negative in 03/2016, not needed again.  . TB: certain at-risk populations, or depending on work requirements and/or travel history Other . Bone Density Test: recommended for men at age 42 . Abdominal Aortic Aneurysm: screening with ultrasound recommended once for men age 54-75 who have ever smoked, I went ahead and ordered this test.

## 2018-08-30 ENCOUNTER — Encounter: Payer: Self-pay | Admitting: Osteopathic Medicine

## 2018-08-30 LAB — LIPID PANEL
CHOL/HDL RATIO: 6.4 (calc) — AB (ref <5.0)
CHOLESTEROL: 217 mg/dL — AB (ref <200)
HDL: 34 mg/dL — AB (ref >40)
LDL Cholesterol (Calc): 142 mg/dL (calc) — ABNORMAL HIGH
Non-HDL Cholesterol (Calc): 183 mg/dL (calc) — ABNORMAL HIGH (ref <130)
Triglycerides: 265 mg/dL — ABNORMAL HIGH (ref <150)

## 2018-08-30 LAB — COMPLETE METABOLIC PANEL WITH GFR
AG RATIO: 1.6 (calc) (ref 1.0–2.5)
ALBUMIN MSPROF: 4.2 g/dL (ref 3.6–5.1)
ALKALINE PHOSPHATASE (APISO): 50 U/L (ref 40–115)
ALT: 21 U/L (ref 9–46)
AST: 18 U/L (ref 10–35)
BILIRUBIN TOTAL: 0.5 mg/dL (ref 0.2–1.2)
BUN / CREAT RATIO: 10 (calc) (ref 6–22)
BUN: 13 mg/dL (ref 7–25)
CHLORIDE: 106 mmol/L (ref 98–110)
CO2: 24 mmol/L (ref 20–32)
Calcium: 9.3 mg/dL (ref 8.6–10.3)
Creat: 1.26 mg/dL — ABNORMAL HIGH (ref 0.70–1.25)
GFR, Est African American: 67 mL/min/{1.73_m2} (ref >=60)
GFR, Est Non African American: 58 mL/min/{1.73_m2} — ABNORMAL LOW (ref >=60)
GLOBULIN: 2.6 g/dL (ref 1.9–3.7)
Glucose, Bld: 122 mg/dL — ABNORMAL HIGH (ref 65–99)
Potassium: 4.5 mmol/L (ref 3.5–5.3)
SODIUM: 139 mmol/L (ref 135–146)
Total Protein: 6.8 g/dL (ref 6.1–8.1)

## 2018-08-30 LAB — HEMOGLOBIN A1C
Hgb A1c MFr Bld: 6.3 % of total Hgb — ABNORMAL HIGH (ref <5.7)
Mean Plasma Glucose: 134 (calc)
eAG (mmol/L): 7.4 (calc)

## 2018-08-30 LAB — CBC WITH DIFFERENTIAL/PLATELET
Absolute Monocytes: 510 cells/uL (ref 200–950)
BASOS ABS: 82 {cells}/uL (ref 0–200)
Basophils Relative: 1.2 %
EOS ABS: 537 {cells}/uL — AB (ref 15–500)
EOS PCT: 7.9 %
HEMATOCRIT: 44.9 % (ref 38.5–50.0)
HEMOGLOBIN: 15.5 g/dL (ref 13.2–17.1)
LYMPHS ABS: 2176 {cells}/uL (ref 850–3900)
MCH: 31.6 pg (ref 27.0–33.0)
MCHC: 34.5 g/dL (ref 32.0–36.0)
MCV: 91.6 fL (ref 80.0–100.0)
MPV: 10.8 fL (ref 7.5–12.5)
Monocytes Relative: 7.5 %
NEUTROS ABS: 3495 {cells}/uL (ref 1500–7800)
NEUTROS PCT: 51.4 %
Platelets: 209 10*3/uL (ref 140–400)
RBC: 4.9 10*6/uL (ref 4.20–5.80)
RDW: 13 % (ref 11.0–15.0)
Total Lymphocyte: 32 %
WBC: 6.8 10*3/uL (ref 3.8–10.8)

## 2018-08-30 LAB — TESTOSTERONE: Testosterone: 217 ng/dL — ABNORMAL LOW (ref 250–827)

## 2018-08-30 LAB — PSA, TOTAL WITH REFLEX TO PSA, FREE: PSA, Total: 0.6 ng/mL (ref <=4.0)

## 2018-09-03 MED ORDER — ATORVASTATIN CALCIUM 20 MG PO TABS: 20.0000 mg | ORAL_TABLET | Freq: Every day | ORAL | 3 refills | Status: DC

## 2018-09-03 NOTE — Addendum Note (Signed)
Addended by: Deirdre Pippins on: 09/03/2018 02:07 PM   Modules accepted: Orders

## 2018-09-03 NOTE — Addendum Note (Signed)
Addended by: Deirdre Pippins on: 09/03/2018 04:44 PM   Modules accepted: Orders

## 2018-09-05 ENCOUNTER — Ambulatory Visit (HOSPITAL_BASED_OUTPATIENT_CLINIC_OR_DEPARTMENT_OTHER)
Admission: RE | Admit: 2018-09-05 | Discharge: 2018-09-05 | Disposition: A | Discharge status: Home / Self Care | Payer: Medicare PPO | Source: Ambulatory Visit | Attending: Osteopathic Medicine | Admitting: Osteopathic Medicine

## 2018-09-05 DIAGNOSIS — Z87891 Personal history of nicotine dependence: Secondary | ICD-10-CM | POA: Insufficient documentation

## 2018-09-26 DIAGNOSIS — E782 Mixed hyperlipidemia: Secondary | ICD-10-CM | POA: Insufficient documentation

## 2019-01-22 ENCOUNTER — Other Ambulatory Visit: Payer: Self-pay

## 2019-01-22 MED ORDER — ATORVASTATIN CALCIUM 20 MG PO TABS: 20.0000 mg | ORAL_TABLET | Freq: Every day | ORAL | 1 refills | Status: DC

## 2019-07-26 IMAGING — US US ABDOMINAL AORTA SCREENING AAA
1 series · 14 of 25 positions shown · non-contrast
Comparison: None.

CLINICAL DATA: Male between 65-75 years of age with a smoking
history.

EXAM:
US ABDOMINAL AORTA MEDICARE SCREENING
TECHNIQUE: Ultrasound examination of the abdominal aorta was performed as a
screening evaluation for abdominal aortic aneurysm.

[Series 1: us abdominal aorta screening aaa · 0.35mm/px · 14 of 43 slices shown]
[im 1/43]
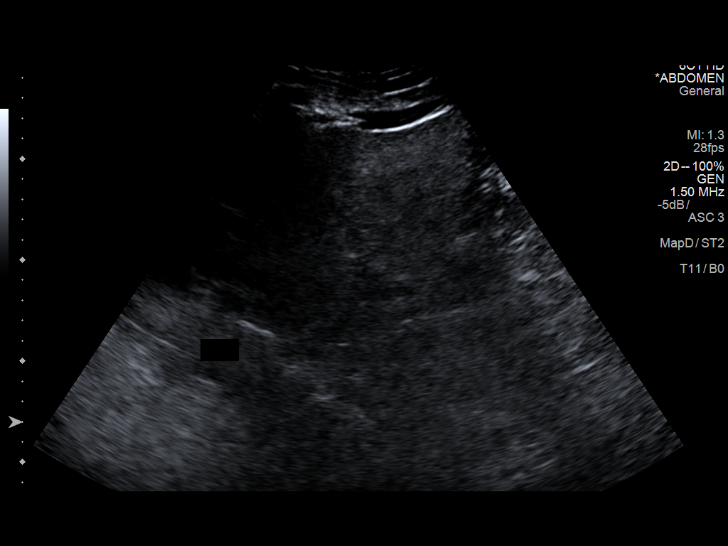
[im 4/43]
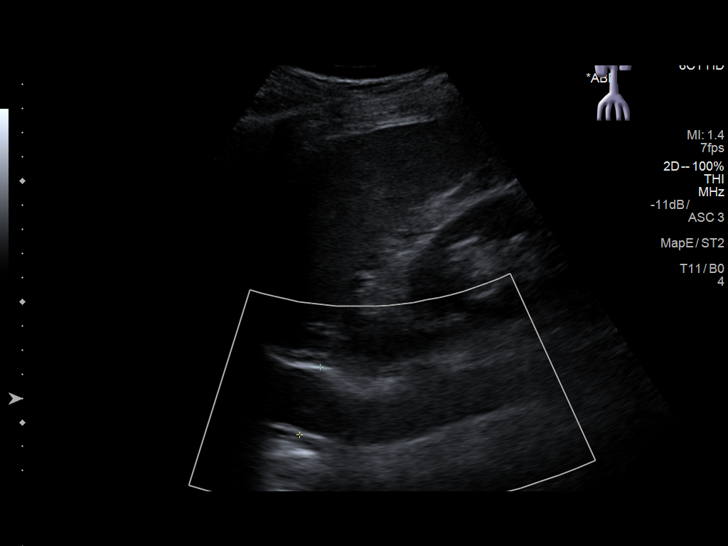
[im 8/43]
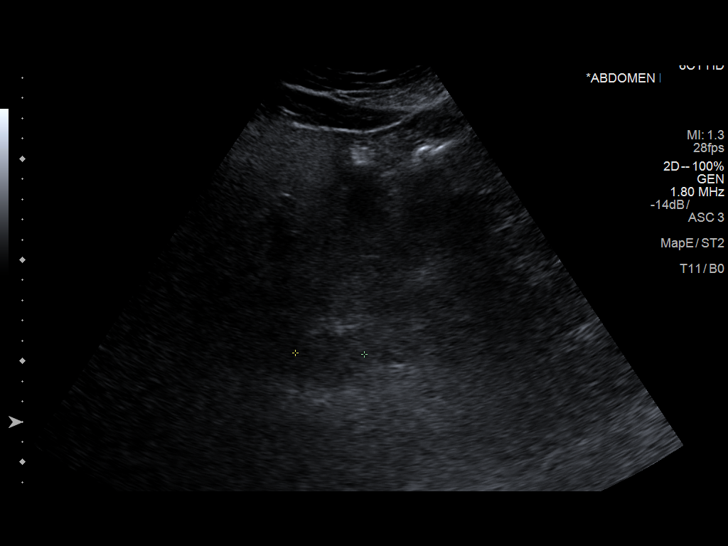
[im 11/43]
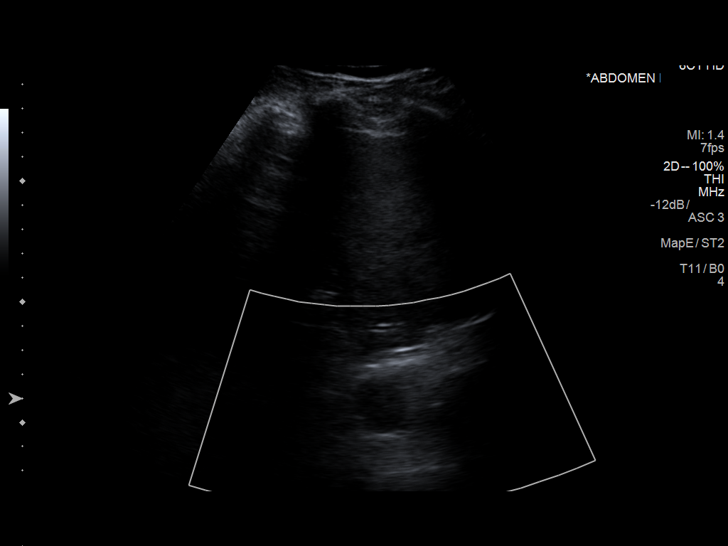
[im 15/43]
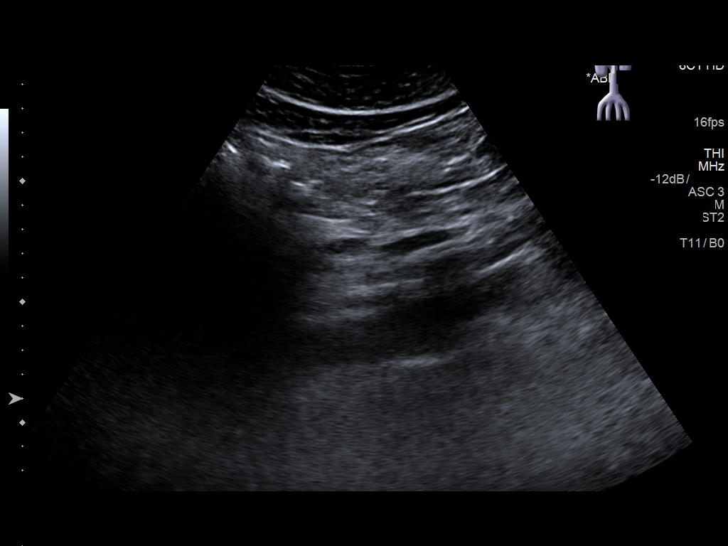
[im 16/43]
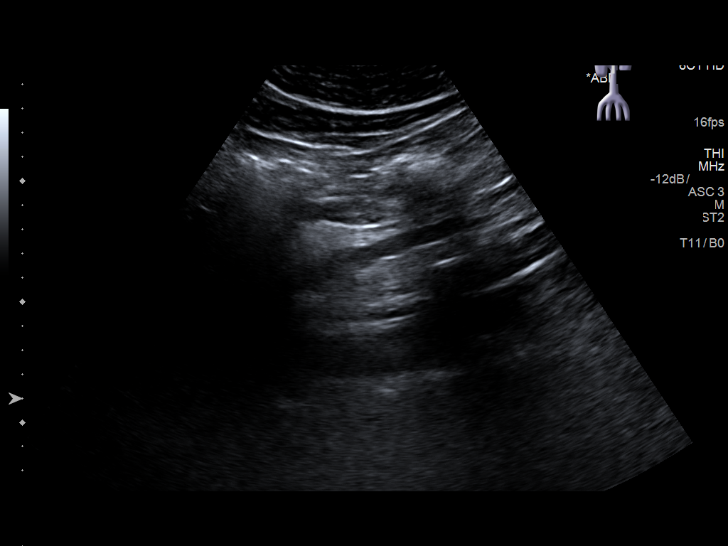
[im 20/43]
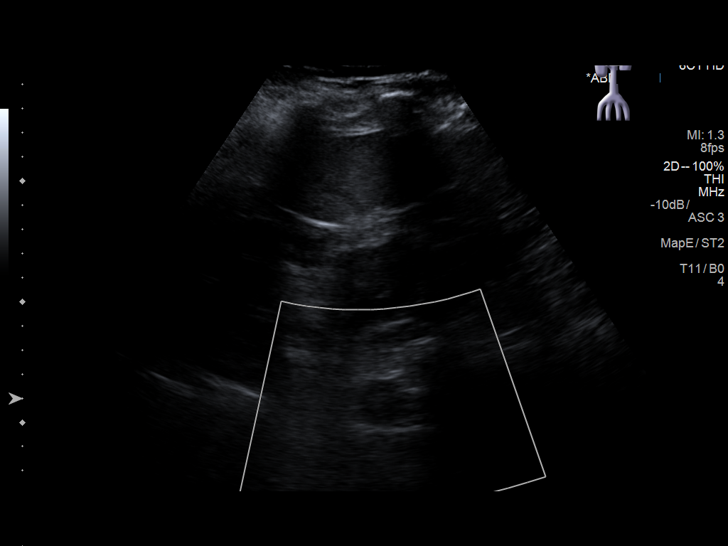
[im 23/43]
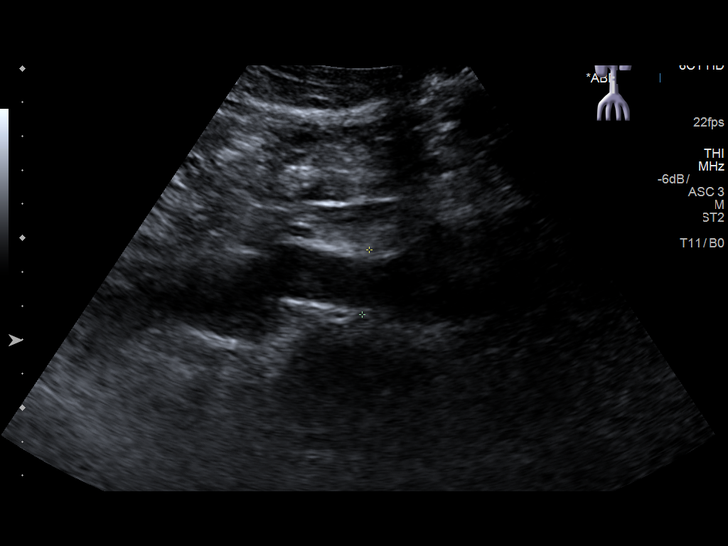
[im 27/43]
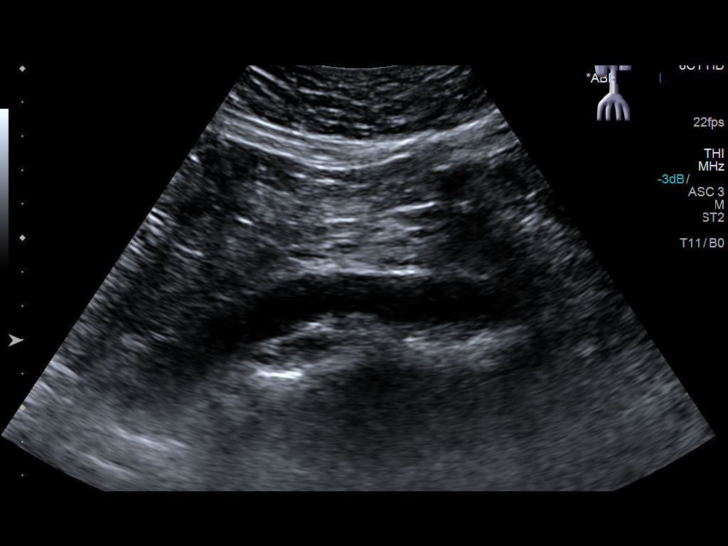
[im 29/43]
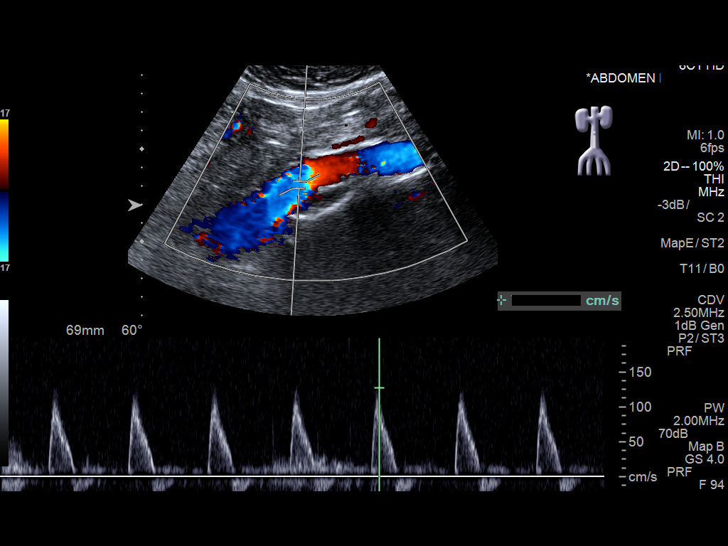
[im 32/43]
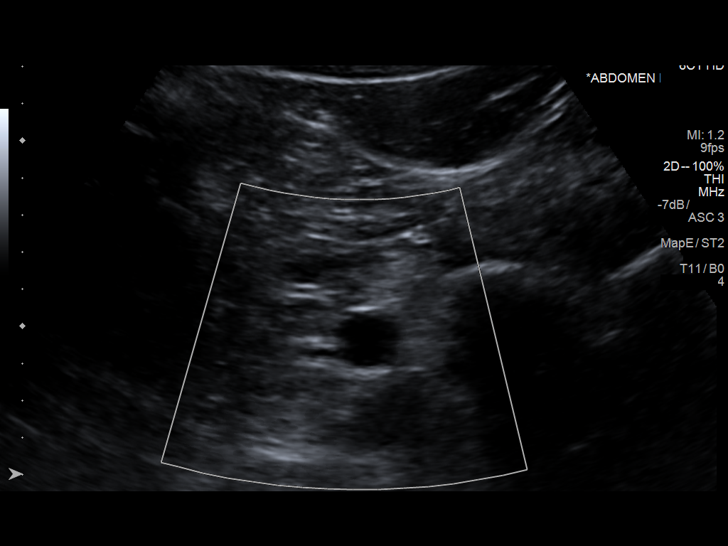
[im 36/43]
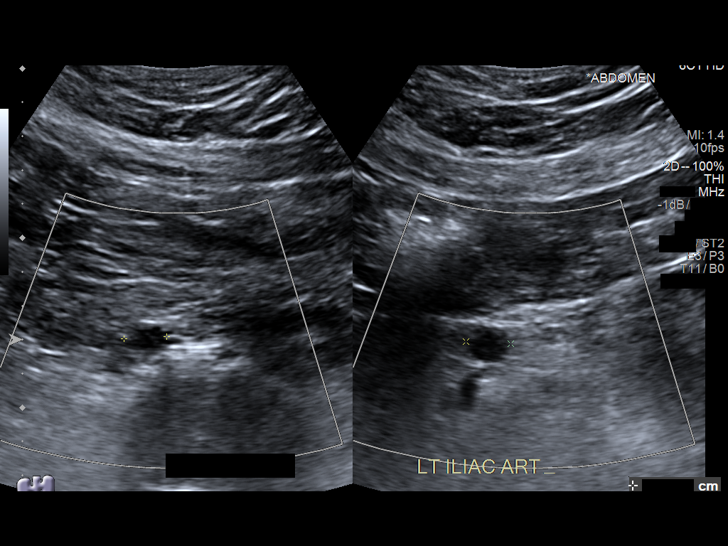
[im 39/43]
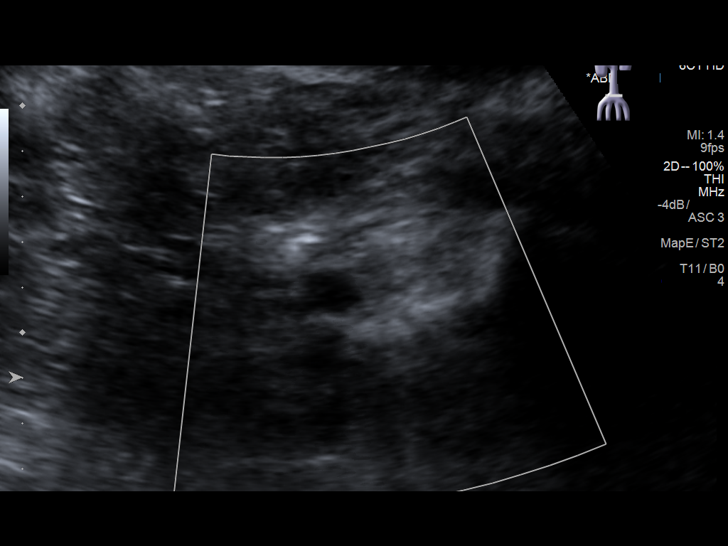
[im 43/43]
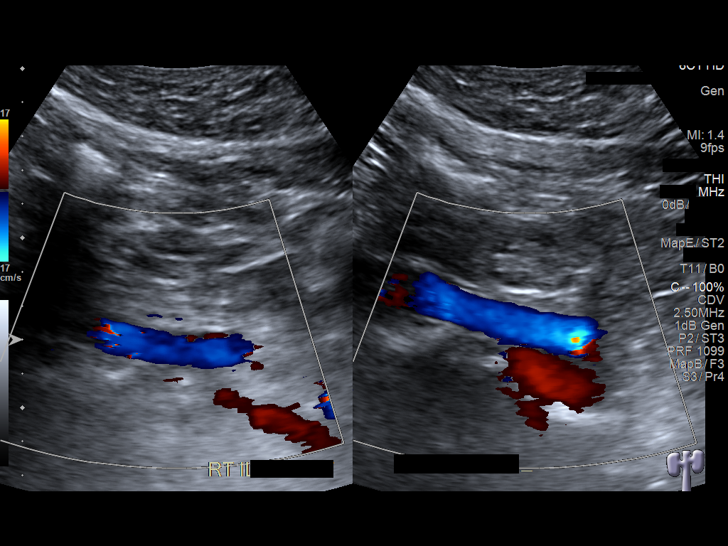

[14 of 25 positions shown; findings below may reference images not displayed]

FINDINGS: Abdominal aortic measurements as follows:

Proximal:  2.9 cm

Mid:  2.4 cm

Distal:  1.9 cm
IMPRESSION: Negative for abdominal aortic aneurysm.

## 2019-07-29 ENCOUNTER — Other Ambulatory Visit: Payer: Self-pay | Admitting: Osteopathic Medicine

## 2019-09-27 ENCOUNTER — Other Ambulatory Visit: Payer: Self-pay | Admitting: Osteopathic Medicine
